# Patient Record
Sex: Female | Born: 1950 | Race: Black or African American | Hispanic: No | Marital: Married
Health system: Southern US, Community
[De-identification: ages and names within clinical notes are randomized; demographics above are authoritative.]

## PROBLEM LIST (undated history)

## (undated) DIAGNOSIS — I639 Cerebral infarction, unspecified: Secondary | ICD-10-CM

---

## 2011-03-01 ENCOUNTER — Emergency Department: Payer: Self-pay | Admitting: *Deleted

## 2015-10-23 ENCOUNTER — Emergency Department (HOSPITAL_COMMUNITY)
Admission: EM | Admit: 2015-10-23 | Discharge: 2015-10-24 | Disposition: A | Discharge status: Home / Self Care | Payer: No Typology Code available for payment source | Attending: Emergency Medicine | Admitting: Emergency Medicine

## 2015-10-23 ENCOUNTER — Encounter (HOSPITAL_COMMUNITY): Payer: Self-pay | Admitting: Emergency Medicine

## 2015-10-23 ENCOUNTER — Emergency Department (HOSPITAL_COMMUNITY): Payer: No Typology Code available for payment source

## 2015-10-23 DIAGNOSIS — S29001A Unspecified injury of muscle and tendon of front wall of thorax, initial encounter: Secondary | ICD-10-CM | POA: Diagnosis not present

## 2015-10-23 DIAGNOSIS — S0990XA Unspecified injury of head, initial encounter: Secondary | ICD-10-CM | POA: Insufficient documentation

## 2015-10-23 DIAGNOSIS — M62838 Other muscle spasm: Secondary | ICD-10-CM | POA: Diagnosis not present

## 2015-10-23 DIAGNOSIS — S3992XA Unspecified injury of lower back, initial encounter: Secondary | ICD-10-CM | POA: Insufficient documentation

## 2015-10-23 DIAGNOSIS — Y998 Other external cause status: Secondary | ICD-10-CM | POA: Diagnosis not present

## 2015-10-23 DIAGNOSIS — Y9389 Activity, other specified: Secondary | ICD-10-CM | POA: Insufficient documentation

## 2015-10-23 DIAGNOSIS — S29002A Unspecified injury of muscle and tendon of back wall of thorax, initial encounter: Secondary | ICD-10-CM | POA: Insufficient documentation

## 2015-10-23 DIAGNOSIS — Z8673 Personal history of transient ischemic attack (TIA), and cerebral infarction without residual deficits: Secondary | ICD-10-CM | POA: Diagnosis not present

## 2015-10-23 DIAGNOSIS — R29810 Facial weakness: Secondary | ICD-10-CM | POA: Diagnosis not present

## 2015-10-23 DIAGNOSIS — Y92410 Unspecified street and highway as the place of occurrence of the external cause: Secondary | ICD-10-CM | POA: Insufficient documentation

## 2015-10-23 DIAGNOSIS — S199XXA Unspecified injury of neck, initial encounter: Secondary | ICD-10-CM | POA: Diagnosis present

## 2015-10-23 HISTORY — DX: Cerebral infarction, unspecified: I63.9

## 2015-10-23 LAB — CBC WITH DIFFERENTIAL/PLATELET
BASOS ABS: 0 10*3/uL (ref 0.0–0.1)
Basophils Relative: 0 %
EOS ABS: 0.4 10*3/uL (ref 0.0–0.7)
EOS PCT: 4 %
HCT: 33.7 % — ABNORMAL LOW (ref 36.0–46.0)
Hemoglobin: 10.8 g/dL — ABNORMAL LOW (ref 12.0–15.0)
LYMPHS PCT: 38 %
Lymphs Abs: 3.4 10*3/uL (ref 0.7–4.0)
MCH: 30.5 pg (ref 26.0–34.0)
MCHC: 32 g/dL (ref 30.0–36.0)
MCV: 95.2 fL (ref 78.0–100.0)
Monocytes Absolute: 0.5 10*3/uL (ref 0.1–1.0)
Monocytes Relative: 6 %
NEUTROS PCT: 52 %
Neutro Abs: 4.6 10*3/uL (ref 1.7–7.7)
PLATELETS: 260 10*3/uL (ref 150–400)
RBC: 3.54 MIL/uL — AB (ref 3.87–5.11)
RDW: 13.3 % (ref 11.5–15.5)
WBC: 8.9 10*3/uL (ref 4.0–10.5)

## 2015-10-23 LAB — BASIC METABOLIC PANEL
ANION GAP: 11 (ref 5–15)
BUN: 19 mg/dL (ref 6–20)
CHLORIDE: 111 mmol/L (ref 101–111)
CO2: 20 mmol/L — ABNORMAL LOW (ref 22–32)
Calcium: 9.6 mg/dL (ref 8.9–10.3)
Creatinine, Ser: 0.89 mg/dL (ref 0.44–1.00)
Glucose, Bld: 199 mg/dL — ABNORMAL HIGH (ref 65–99)
POTASSIUM: 3.8 mmol/L (ref 3.5–5.1)
SODIUM: 142 mmol/L (ref 135–145)

## 2015-10-23 MED ORDER — NAPROXEN 500 MG PO TABS: 500.0000 mg | ORAL_TABLET | Freq: Two times a day (BID) | ORAL | Status: AC

## 2015-10-23 MED ORDER — CYCLOBENZAPRINE HCL 10 MG PO TABS: 10.0000 mg | ORAL_TABLET | Freq: Two times a day (BID) | ORAL | Status: AC | PRN

## 2015-10-23 MED ORDER — CYCLOBENZAPRINE HCL 10 MG PO TABS
5.0000 mg | ORAL_TABLET | Freq: Once | ORAL | Status: AC
  Administered 2015-10-23: 5 mg via ORAL
  Filled 2015-10-23: qty 1

## 2015-10-23 MED ORDER — NAPROXEN 250 MG PO TABS
500.0000 mg | ORAL_TABLET | Freq: Once | ORAL | Status: AC
  Administered 2015-10-23: 500 mg via ORAL
  Filled 2015-10-23: qty 2
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
## 2018-02-25 ENCOUNTER — Emergency Department (HOSPITAL_COMMUNITY)
Admission: EM | Admit: 2018-02-25 | Discharge: 2018-02-25 | Disposition: A | Discharge status: Home / Self Care | Payer: Medicare Other
